# Patient Record
Sex: Male | Born: 1990 | Race: White | Hispanic: No | Marital: Single | Smoking: Current every day smoker
Health system: Southern US, Community
[De-identification: ages and names within clinical notes are randomized; demographics above are authoritative.]

---

## 2015-07-12 ENCOUNTER — Ambulatory Visit (HOSPITAL_COMMUNITY)
Admission: EM | Admit: 2015-07-12 | Discharge: 2015-07-12 | Disposition: A | Discharge status: Home / Self Care | Payer: PRIVATE HEALTH INSURANCE | Attending: Emergency Medicine | Admitting: Emergency Medicine

## 2015-07-12 ENCOUNTER — Emergency Department (HOSPITAL_COMMUNITY): Payer: PRIVATE HEALTH INSURANCE

## 2015-07-12 ENCOUNTER — Emergency Department (HOSPITAL_COMMUNITY): Payer: PRIVATE HEALTH INSURANCE | Admitting: Certified Registered Nurse Anesthetist

## 2015-07-12 ENCOUNTER — Encounter (HOSPITAL_COMMUNITY)
Admission: EM | Disposition: A | Discharge status: Home / Self Care | Payer: Self-pay | Source: Home / Self Care | Attending: Emergency Medicine

## 2015-07-12 ENCOUNTER — Encounter (HOSPITAL_COMMUNITY): Payer: Self-pay | Admitting: Emergency Medicine

## 2015-07-12 DIAGNOSIS — S90551A Superficial foreign body, right ankle, initial encounter: Secondary | ICD-10-CM

## 2015-07-12 DIAGNOSIS — Y93H3 Activity, building and construction: Secondary | ICD-10-CM | POA: Insufficient documentation

## 2015-07-12 DIAGNOSIS — W294XXA Contact with nail gun, initial encounter: Secondary | ICD-10-CM | POA: Diagnosis not present

## 2015-07-12 DIAGNOSIS — Y990 Civilian activity done for income or pay: Secondary | ICD-10-CM | POA: Insufficient documentation

## 2015-07-12 DIAGNOSIS — S91041A Puncture wound with foreign body, right ankle, initial encounter: Secondary | ICD-10-CM | POA: Insufficient documentation

## 2015-07-12 DIAGNOSIS — Z23 Encounter for immunization: Secondary | ICD-10-CM | POA: Diagnosis not present

## 2015-07-12 DIAGNOSIS — F17200 Nicotine dependence, unspecified, uncomplicated: Secondary | ICD-10-CM | POA: Diagnosis not present

## 2015-07-12 HISTORY — PX: INCISION AND DRAINAGE WOUND WITH FOREIGN BODY REMOVAL: SHX5635

## 2015-07-12 SURGERY — INCISION AND DRAINAGE WOUND WITH FOREIGN BODY REMOVAL
Anesthesia: General | Site: Leg Lower | Laterality: Right

## 2015-07-12 MED ORDER — CEFAZOLIN SODIUM-DEXTROSE 2-3 GM-% IV SOLR
INTRAVENOUS | Status: AC
  Administered 2015-07-12: 2 g via INTRAVENOUS
  Filled 2015-07-12: qty 50

## 2015-07-12 MED ORDER — OXYCODONE HCL 5 MG PO TABS: 5.0000 mg | ORAL_TABLET | Freq: Once | ORAL | Status: DC | PRN

## 2015-07-12 MED ORDER — LACTATED RINGERS IV SOLN
INTRAVENOUS | Status: DC
  Administered 2015-07-12: 12:00:00 via INTRAVENOUS

## 2015-07-12 MED ORDER — OXYCODONE-ACETAMINOPHEN 5-325 MG PO TABS: 2.0000 | ORAL_TABLET | ORAL | Status: AC | PRN

## 2015-07-12 MED ORDER — LIDOCAINE HCL (CARDIAC) 20 MG/ML IV SOLN
INTRAVENOUS | Status: AC
  Filled 2015-07-12: qty 5

## 2015-07-12 MED ORDER — PROPOFOL 10 MG/ML IV BOLUS
INTRAVENOUS | Status: DC | PRN
  Administered 2015-07-12: 200 mg via INTRAVENOUS
  Administered 2015-07-12: 50 mg via INTRAVENOUS

## 2015-07-12 MED ORDER — KETOROLAC TROMETHAMINE 30 MG/ML IJ SOLN: 30.0000 mg | Freq: Once | INTRAMUSCULAR | Status: DC | PRN

## 2015-07-12 MED ORDER — MIDAZOLAM HCL 2 MG/2ML IJ SOLN
INTRAMUSCULAR | Status: AC
  Filled 2015-07-12: qty 2

## 2015-07-12 MED ORDER — ARTIFICIAL TEARS OP OINT
TOPICAL_OINTMENT | OPHTHALMIC | Status: AC
  Filled 2015-07-12: qty 3.5

## 2015-07-12 MED ORDER — MEPERIDINE HCL 25 MG/ML IJ SOLN: 6.2500 mg | INTRAMUSCULAR | Status: DC | PRN

## 2015-07-12 MED ORDER — LACTATED RINGERS IV SOLN
INTRAVENOUS | Status: DC | PRN
  Administered 2015-07-12: 13:00:00 via INTRAVENOUS

## 2015-07-12 MED ORDER — SODIUM CHLORIDE 0.9 % IR SOLN
Status: DC | PRN
  Administered 2015-07-12: 6000 mL

## 2015-07-12 MED ORDER — ONDANSETRON HCL 4 MG/2ML IJ SOLN
INTRAMUSCULAR | Status: DC | PRN
  Administered 2015-07-12: 4 mg via INTRAVENOUS

## 2015-07-12 MED ORDER — OXYCODONE HCL 5 MG/5ML PO SOLN: 5.0000 mg | Freq: Once | ORAL | Status: DC | PRN

## 2015-07-12 MED ORDER — CEFAZOLIN SODIUM 1-5 GM-% IV SOLN
1.0000 g | Freq: Once | INTRAVENOUS | Status: AC
  Administered 2015-07-12: 1 g via INTRAVENOUS
  Filled 2015-07-12: qty 50

## 2015-07-12 MED ORDER — FENTANYL CITRATE (PF) 100 MCG/2ML IJ SOLN
100.0000 ug | Freq: Once | INTRAMUSCULAR | Status: AC
  Administered 2015-07-12 (×3): 25 ug via INTRAVENOUS
  Administered 2015-07-12: 50 ug via INTRAVENOUS
  Administered 2015-07-12: 25 ug via INTRAVENOUS
  Filled 2015-07-12: qty 2

## 2015-07-12 MED ORDER — 0.9 % SODIUM CHLORIDE (POUR BTL) OPTIME
TOPICAL | Status: DC | PRN
  Administered 2015-07-12: 1000 mL

## 2015-07-12 MED ORDER — CEPHALEXIN 500 MG PO CAPS: 500.0000 mg | ORAL_CAPSULE | Freq: Three times a day (TID) | ORAL | Status: AC

## 2015-07-12 MED ORDER — FENTANYL CITRATE (PF) 250 MCG/5ML IJ SOLN
INTRAMUSCULAR | Status: AC
  Filled 2015-07-12: qty 5

## 2015-07-12 MED ORDER — HYDROMORPHONE HCL 1 MG/ML IJ SOLN: 0.2500 mg | INTRAMUSCULAR | Status: DC | PRN

## 2015-07-12 MED ORDER — PROMETHAZINE HCL 12.5 MG PO TABS: 12.5000 mg | ORAL_TABLET | Freq: Four times a day (QID) | ORAL | Status: AC | PRN

## 2015-07-12 MED ORDER — MIDAZOLAM HCL 2 MG/2ML IJ SOLN
INTRAMUSCULAR | Status: DC | PRN
  Administered 2015-07-12: 2 mg via INTRAVENOUS

## 2015-07-12 MED ORDER — LIDOCAINE HCL (CARDIAC) 20 MG/ML IV SOLN
INTRAVENOUS | Status: DC | PRN
  Administered 2015-07-12: 100 mg via INTRAVENOUS

## 2015-07-12 MED ORDER — TETANUS-DIPHTH-ACELL PERTUSSIS 5-2.5-18.5 LF-MCG/0.5 IM SUSP
0.5000 mL | Freq: Once | INTRAMUSCULAR | Status: AC
Start: 2015-07-12 — End: 2015-07-12
  Administered 2015-07-12: 0.5 mL via INTRAMUSCULAR
  Filled 2015-07-12: qty 0.5

## 2015-07-12 MED ORDER — PROMETHAZINE HCL 25 MG/ML IJ SOLN: 6.2500 mg | INTRAMUSCULAR | Status: DC | PRN

## 2015-07-12 SURGICAL SUPPLY — 40 items
BANDAGE ELASTIC 6 VELCRO ST LF (GAUZE/BANDAGES/DRESSINGS) ×3 IMPLANT
BLADE SURG 10 STRL SS (BLADE) IMPLANT
BLADE SURG 15 STRL LF DISP TIS (BLADE) IMPLANT
BLADE SURG 15 STRL SS (BLADE)
BNDG GAUZE ELAST 4 BULKY (GAUZE/BANDAGES/DRESSINGS) ×3 IMPLANT
CONT SPEC 4OZ CLIKSEAL STRL BL (MISCELLANEOUS) ×3 IMPLANT
COVER SURGICAL LIGHT HANDLE (MISCELLANEOUS) ×3 IMPLANT
DRAPE OEC MINIVIEW 54X84 (DRAPES) ×3 IMPLANT
DRAPE SURG 17X23 STRL (DRAPES) ×3 IMPLANT
DRSG EMULSION OIL 3X3 NADH (GAUZE/BANDAGES/DRESSINGS) ×3 IMPLANT
DURAPREP 26ML APPLICATOR (WOUND CARE) ×3 IMPLANT
ELECT CAUTERY BLADE 6.4 (BLADE) ×3 IMPLANT
ELECT REM PT RETURN 9FT ADLT (ELECTROSURGICAL) ×3
ELECTRODE REM PT RTRN 9FT ADLT (ELECTROSURGICAL) ×1 IMPLANT
GAUZE SPONGE 4X4 12PLY STRL (GAUZE/BANDAGES/DRESSINGS) ×3 IMPLANT
GLOVE BIOGEL M 6.5 STRL (GLOVE) ×3 IMPLANT
GLOVE BIOGEL PI IND STRL 6.5 (GLOVE) ×1 IMPLANT
GLOVE BIOGEL PI IND STRL 8 (GLOVE) ×1 IMPLANT
GLOVE BIOGEL PI INDICATOR 6.5 (GLOVE) ×2
GLOVE BIOGEL PI INDICATOR 8 (GLOVE) ×2
GLOVE BIOGEL PI ORTHO PRO 7.5 (GLOVE) ×2
GLOVE BIOGEL PI ORTHO PRO SZ8 (GLOVE) ×2
GLOVE PI ORTHO PRO STRL 7.5 (GLOVE) ×1 IMPLANT
GLOVE PI ORTHO PRO STRL SZ8 (GLOVE) ×1 IMPLANT
GLOVE SURG ORTHO 8.0 STRL STRW (GLOVE) ×3 IMPLANT
GOWN STRL REUS W/ TWL LRG LVL3 (GOWN DISPOSABLE) ×1 IMPLANT
GOWN STRL REUS W/ TWL XL LVL3 (GOWN DISPOSABLE) ×1 IMPLANT
GOWN STRL REUS W/TWL 2XL LVL3 (GOWN DISPOSABLE) ×3 IMPLANT
GOWN STRL REUS W/TWL LRG LVL3 (GOWN DISPOSABLE) ×2
GOWN STRL REUS W/TWL XL LVL3 (GOWN DISPOSABLE) ×2
KIT BASIN OR (CUSTOM PROCEDURE TRAY) ×3 IMPLANT
MANIFOLD NEPTUNE II (INSTRUMENTS) ×3 IMPLANT
PACK ORTHO EXTREMITY (CUSTOM PROCEDURE TRAY) ×3 IMPLANT
SET IRRIG Y TYPE TUR BLADDER L (SET/KITS/TRAYS/PACK) ×3 IMPLANT
SUCTION FRAZIER TIP 10 FR DISP (SUCTIONS) ×3 IMPLANT
SUT ETHILON 3 0 PS 1 (SUTURE) ×3 IMPLANT
SUT VIC AB 0 CT1 27 (SUTURE) ×2
SUT VIC AB 0 CT1 27XBRD ANBCTR (SUTURE) ×1 IMPLANT
TOWEL OR 17X26 10 PK STRL BLUE (TOWEL DISPOSABLE) ×3 IMPLANT
TUBING TUR DISP (UROLOGICAL SUPPLIES) IMPLANT

## 2015-07-12 NOTE — Discharge Instructions (Signed)
Bear weight as tolerated in your boot  OK to take off boot for sleeping  Remove dressings in 5 days and ok to shower at that point, keep incision covered

## 2015-07-12 NOTE — ED Notes (Signed)
Patients friend handed front desk pateints car key and blue paper. Paper and Car key given to patients girlfriend.

## 2015-07-12 NOTE — Op Note (Signed)
07/12/2015  2:01 PM  PATIENT:  Eddie Sanchez    PRE-OPERATIVE DIAGNOSIS:  Foreign Body Right Ankle  POST-OPERATIVE DIAGNOSIS:  Same  PROCEDURE:  INCISION AND DRAINAGE WOUND WITH FOREIGN BODY REMOVAL  SURGEON:  Maeola Mchaney, Jewel Baize, MD  ASSISTANT: Janace Litten, OPA-C, present and scrubbed throughout the case, critical for completion in a timely fashion, and for retraction, instrumentation, and closure.   ANESTHESIA:   gen  PREOPERATIVE INDICATIONS:  [name] is a  24 y.o. male with a diagnosis of Foreign Body Right Ankle who failed conservative measures and elected for surgical management.    The risks benefits and alternatives were discussed with the patient preoperatively including but not limited to the risks of infection, bleeding, nerve injury, cardiopulmonary complications, the need for revision surgery, among others, and the patient was willing to proceed.  OPERATIVE IMPLANTS: none  OPERATIVE FINDINGS: no retained metalic FB  BLOOD LOSS: min  COMPLICATIONS: none  TOURNIQUET TIME: none  OPERATIVE PROCEDURE:  Patient was identified in the preoperative holding area and site was marked by me He was transported to the operating theater and placed on the table in supine position taking care to pad all bony prominences. After a preincinduction time out anesthesia was induced. The right lower extremity was prepped and draped in normal sterile fashion and a pre-incision timeout was performed. He received ancef for preoperative antibiotics.   After prepping and draping I removed the nail from his medial Mal. It came out intact the wiring that holds the nails together came out with it I then took multiple fluoroscopic x-rays to confirm that there was no retained metal wiring.  I then made a 3 cm incision at the site of a medial arthroscopic portal and dissected down bluntly protecting his neurovascular strand structures and his tibialis anterior tendon. Next a made a capsulotomy and  exposed the joint. I visualized the joint I was able to see the penetration and the talus of the nail. It penetrated the nonweightbearing dome.  I then examined the gutters of the ankle and confirm no retained foreign body. I then irrigated the ankle with 6 L of saline. With thorough agitation.  After this I closed the capsule with a Vicryls stitch followed by a nylon stitch and the skin.  Sterile dressing was applied he was placed in a boot and taken the PACU in stable condition after being awoken.  POST OPERATIVE PLAN: WBAT, Keflex. Ambulate for dvt px.     This note was generated using a template and dragon dictation system. In light of that, I have reviewed the note and all aspects of it are applicable to this case. Any dictation errors are due to the computerized dictation system.

## 2015-07-12 NOTE — H&P (Signed)
ORTHOPAEDIC CONSULTATION  REQUESTING PHYSICIAN: Sherwood Gambler, MD  Chief Complaint: nail injury to right ankle   HPI: Eddie Sanchez is a 23 y.o. 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 He is not sure of his tetanus status. He denies any other injuries.  History reviewed. No pertinent past medical history. History reviewed. No pertinent past surgical history. History   Social History  . Marital Status: Single    Spouse Name: N/A  . Number of Children: N/A  . Years of Education: N/A   Social History Main Topics  . Smoking status: Current Every Day Smoker  . Smokeless tobacco: Not on file  . Alcohol Use: Yes  . Drug Use: No  . Sexual Activity: Not on file   Other Topics Concern  . None   Social History Narrative  . None   No family history on file. No Known Allergies Prior to Admission medications   Medication Sig Start Date End Date Taking? Authorizing Provider  ibuprofen (ADVIL,MOTRIN) 200 MG tablet Take 400 mg by mouth daily as needed for mild pain.   Yes Historical Provider, MD   Dg Ankle Complete Right  07/12/2015   CLINICAL DATA:  Nail gun injury to ankle  EXAM: RIGHT ANKLE - COMPLETE 3+ VIEW  COMPARISON:  None.  FINDINGS: Metallic nail through the medial malleolus and likely into the medial aspect of the talus.  Ankle mortise is otherwise intact.  Visualized soft tissues are otherwise unremarkable.  IMPRESSION: Metallic nail through the medial malleolus and likely into the medial aspect of the talus.   Electronically Signed   By: Julian Hy M.D.   On: 07/12/2015 09:36    Positive ROS: All other systems have been reviewed and were otherwise negative with the exception of those mentioned in the HPI and as above.  Labs cbc No results for input(s): WBC, HGB, HCT, PLT in the last 72 hours.  Labs inflam No results for input(s): CRP in the last 72 hours.  Invalid input(s):  ESR  Labs coag No results for input(s): INR, PTT in the last 72 hours.  Invalid input(s): PT  No results for input(s): NA, K, CL, CO2, GLUCOSE, BUN, CREATININE, CALCIUM in the last 72 hours.  Physical Exam: Filed Vitals:   07/12/15 0900  BP: 116/75  Pulse: 89  Temp:   Resp:    General: Alert, no acute distress Cardiovascular: No pedal edema Respiratory: No cyanosis, no use of accessory musculature GI: No organomegaly, abdomen is soft and non-tender Skin: No lesions in the area of chief complaint other than those listed below in MSK exam.  Neurologic: Sensation intact distally Psychiatric: Patient is competent for consent with normal mood and affect Lymphatic: No axillary or cervical lymphadenopathy  MUSCULOSKELETAL:  On the right lower extremity he has a nail head visible with penetration just above the medial Mal. Distally he has sensation intact to all nerve roots good pulses and is able wiggle his toes. Other extremities are atraumatic with painless ROM and NVI.  Assessment: Nail penetration of ankle joint  Plan: Updated tetanus.  I will take him urgently to the operating room for removal of nail followed by surgical arthrotomy irrigation and debridement of his ankle joint given the nail penetration of the ankle joint. Per his report the nails are connected by 2 wires.    Renette Butters, MD Cell [phone]  07/12/2015 9:46 AM

## 2015-07-12 NOTE — ED Notes (Signed)
Pt belongings placed in bags, including shirt pants socks belt wallet cell phone and shoes. Given to patient's girlfriend.

## 2015-07-12 NOTE — Progress Notes (Signed)
Orthopedic Tech Progress Note Patient Details:  Eddie Sanchez 26-Mar-1991 [id number] Delivered CAM walker to OR. Ortho Devices Type of Ortho Device: CAM walker Ortho Device/Splint Interventions: Other (comment)   Lesle Chris 07/12/2015, 1:40 PM

## 2015-07-12 NOTE — Anesthesia Preprocedure Evaluation (Addendum)
Anesthesia Evaluation  Patient identified by MRN, date of birth, ID band Patient awake    Reviewed: Allergy & Precautions, NPO status , Patient's Chart, lab work & pertinent test results  Airway Mallampati: I  TM Distance: >3 FB Neck ROM: Full    Dental no notable dental hx. (+) Dental Advisory Given, Teeth Intact   Pulmonary neg pulmonary ROS, Current Smoker,  breath sounds clear to auscultation  Pulmonary exam normal       Cardiovascular negative cardio ROS Normal cardiovascular examRhythm:Regular Rate:Normal     Neuro/Psych negative neurological ROS  negative psych ROS   GI/Hepatic negative GI ROS, Neg liver ROS,   Endo/Other  negative endocrine ROS  Renal/GU negative Renal ROS     Musculoskeletal negative musculoskeletal ROS (+)   Abdominal   Peds  Hematology negative hematology ROS (+)   Anesthesia Other Findings   Reproductive/Obstetrics                           Anesthesia Physical Anesthesia Plan  ASA: II  Anesthesia Plan: General   Post-op Pain Management:    Induction: Intravenous  Airway Management Planned: LMA  Additional Equipment:   Intra-op Plan:   Post-operative Plan: Extubation in OR  Informed Consent: I have reviewed the patients History and Physical, chart, labs and discussed the procedure including the risks, benefits and alternatives for the proposed anesthesia with the patient or authorized representative who has indicated his/her understanding and acceptance.   Dental advisory given  Plan Discussed with: CRNA, Anesthesiologist and Surgeon  Anesthesia Plan Comments:        Anesthesia Quick Evaluation

## 2015-07-12 NOTE — ED Notes (Signed)
Pt. 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

## 2015-07-12 NOTE — ED Notes (Signed)
Pt states he does not think he needs an IV or IV pain medications. States it does not hurt that bad.

## 2015-07-12 NOTE — Anesthesia Procedure Notes (Signed)
Procedure Name: LMA Insertion Date/Time: 07/12/2015 1:15 PM Performed by: Rise Patience T Pre-anesthesia Checklist: Patient identified, Emergency Drugs available, Suction available and Patient being monitored Patient Re-evaluated:Patient Re-evaluated prior to inductionOxygen Delivery Method: Circle system utilized Preoxygenation: Pre-oxygenation with 100% oxygen Intubation Type: IV induction Ventilation: Mask ventilation without difficulty LMA: LMA inserted LMA Size: 4.0 Number of attempts: 1 Placement Confirmation: positive ETCO2 and breath sounds checked- equal and bilateral Tube secured with: Tape Dental Injury: Teeth and Oropharynx as per pre-operative assessment

## 2015-07-12 NOTE — Transfer of Care (Signed)
Immediate Anesthesia Transfer of Care Note  Patient: Eddie Sanchez  Procedure(s) Performed: Procedure(s): INCISION AND DRAINAGE WOUND WITH FOREIGN BODY REMOVAL (Right)  Patient Location: PACU  Anesthesia Type:General  Level of Consciousness: awake, alert  and oriented  Airway & Oxygen Therapy: Patient Spontanous Breathing and Patient connected to nasal cannula oxygen  Post-op Assessment: Report given to RN, Post -op Vital signs reviewed and stable and Patient moving all extremities X 4  Post vital signs: Reviewed and stable  Last Vitals:  Filed Vitals:   07/12/15 1121  BP: 114/55  Pulse: 83  Temp:   Resp: 16    Complications: No apparent anesthesia complications

## 2015-07-12 NOTE — Anesthesia Postprocedure Evaluation (Signed)
Anesthesia Post Note  Patient: Eddie Sanchez  Procedure(s) Performed: Procedure(s) (LRB): INCISION AND DRAINAGE WOUND WITH FOREIGN BODY REMOVAL (Right)  Anesthesia type: General  Patient location: PACU  Post pain: Pain level controlled  Post assessment: Post-op Vital signs reviewed  Last Vitals: BP 118/68 mmHg  Pulse 62  Temp(Src) 36.4 C (Oral)  Resp 18  Ht 6' (1.829 m)  Wt 170 lb (77.111 kg)  BMI 23.05 kg/m2  SpO2 98%  Post vital signs: Reviewed  Level of consciousness: sedated  Complications: No apparent anesthesia complications

## 2015-07-12 NOTE — ED Provider Notes (Signed)
CSN: [id number]     Arrival date & time 07/12/15  0825 History   First MD Initiated Contact with Patient 07/12/15 [phone]     Chief Complaint  Patient presents with  . Ankle Pain    nail     (Consider location/radiation/quality/duration/timing/severity/associated sxs/prior Treatment) HPI  24 year old male presents after accidentally shooting a nail into his right medial ankle. The patient was receiving this morning and states that the trigger on his nail gun was acting up all morning. He was getting another single and put the nail gun down on his foot and that seemed to set off the trigger and his medial ankle was punctured with the nail. The nail is hubbed and is approximately 1-1/4 inch in length. Rates his pain as a 5/10. When he moves his toes he feels a little tingling in them but otherwise denies numbness or weakness. He did not try and walk on his leg. Did not eat breakfast this morning, last ate last night around 6 PM.  History reviewed. No pertinent past medical history. History reviewed. No pertinent past surgical history. No family history on file. History  Substance Use Topics  . Smoking status: Current Every Day Smoker  . Smokeless tobacco: Not on file  . Alcohol Use: Yes    Review of Systems  Musculoskeletal: Positive for arthralgias.  Skin: Positive for wound. Negative for color change.  Neurological: Negative for weakness and numbness.  All other systems reviewed and are negative.     Allergies  Review of patient's allergies indicates no known allergies.  Home Medications   Prior to Admission medications   Medication Sig Start Date End Date Taking? Authorizing Provider  ibuprofen (ADVIL,MOTRIN) 200 MG tablet Take 400 mg by mouth daily as needed for mild pain.   Yes Historical Provider, MD   BP 123/71 mmHg  Pulse 106  Temp(Src) 98 F (36.7 C) (Oral)  Resp 17  Ht 6' (1.829 m)  Wt 170 lb (77.111 kg)  BMI 23.05 kg/m2  SpO2 98% Physical Exam    Constitutional: He is oriented to person, place, and time. He appears well-developed and well-nourished.  HENT:  Head: Normocephalic and atraumatic.  Right Ear: External ear normal.  Left Ear: External ear normal.  Nose: Nose normal.  Eyes: Right eye exhibits no discharge. Left eye exhibits no discharge.  Neck: Neck supple.  Cardiovascular: Intact distal pulses.   Pulses:      Dorsalis pedis pulses are 2+ on the right side, and 2+ on the left side.  Pulmonary/Chest: Effort normal.  Abdominal: Soft. He exhibits no distension. There is no tenderness.  Musculoskeletal:       Right ankle: He exhibits decreased range of motion. Tenderness.       Feet:  No tenting of the skin  Neurological: He is alert and oriented to person, place, and time.  Skin: Skin is warm and dry.  Nursing note and vitals reviewed.   ED Course  Procedures (including critical care time) Labs Review Labs Reviewed - No data to display  Imaging Review Dg Ankle Complete Right  07/12/2015   CLINICAL DATA:  Nail gun injury to ankle  EXAM: RIGHT ANKLE - COMPLETE 3+ VIEW  COMPARISON:  None.  FINDINGS: Metallic nail through the medial malleolus and likely into the medial aspect of the talus.  Ankle mortise is otherwise intact.  Visualized soft tissues are otherwise unremarkable.  IMPRESSION: Metallic nail through the medial malleolus and likely into the medial aspect of the talus.  Electronically Signed   By: Charline Bills M.D.   On: 07/12/2015 09:36     EKG Interpretation None      MDM   Final diagnoses:  Foreign body of ankle, right, initial encounter    Patient is NV intact, declines pain meds and appears well. Last ate last night. Xray shows nail has gone through joint. D/w ortho, Dr. Eulah Pont, who plans to take to OR for removal and good washout. Last tetanus 4 years ago. Will give ancef per ortho.    Pricilla Loveless, MD 07/12/15 [phone]

## 2015-07-14 ENCOUNTER — Encounter (HOSPITAL_COMMUNITY): Payer: Self-pay | Admitting: Orthopedic Surgery

## 2017-02-25 IMAGING — DX DG ANKLE COMPLETE 3+V*R*
3 series · 3 of 3 positions shown · non-contrast
Comparison: None.

CLINICAL DATA: Nail gun injury to ankle

EXAM:
RIGHT ANKLE - COMPLETE 3+ VIEW

[ankle ap]
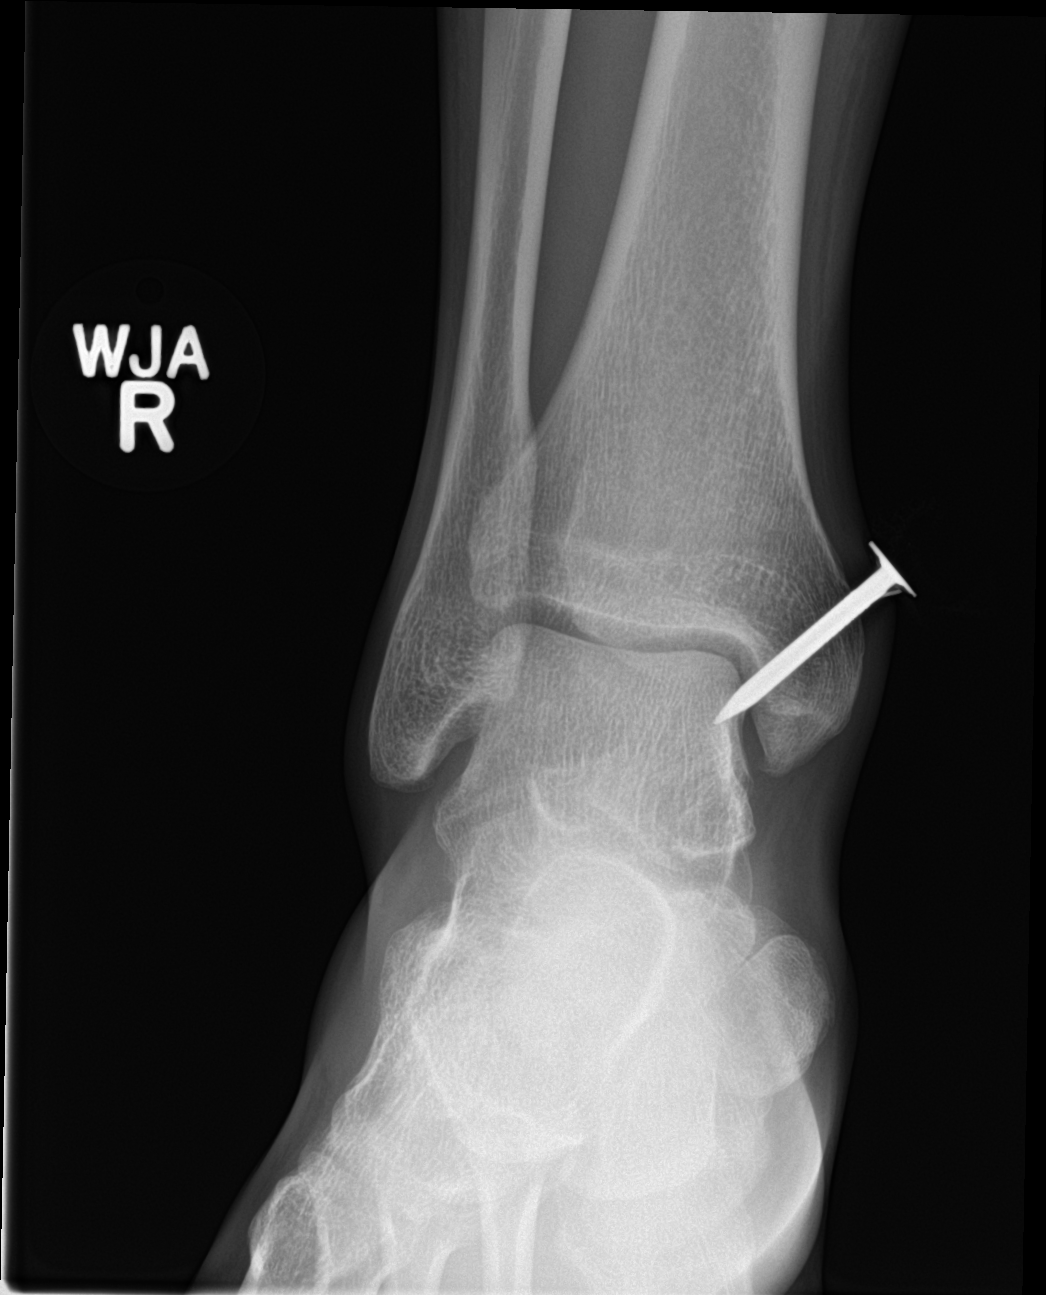

[ankle obl]
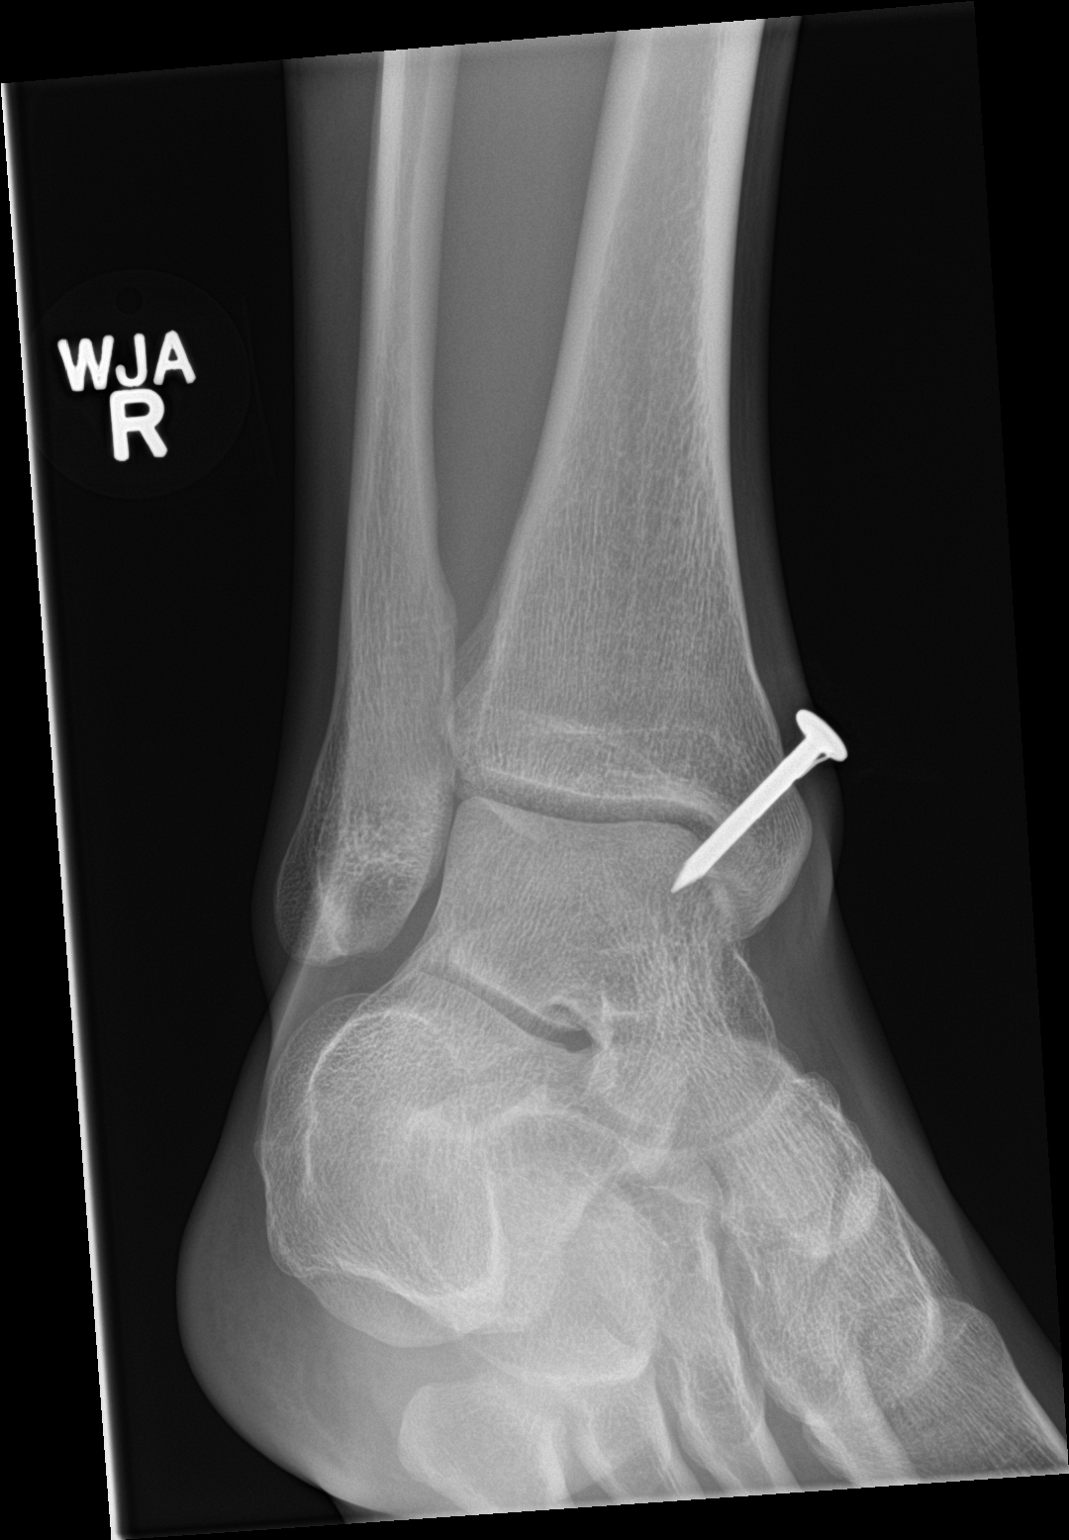

[ankle lat]
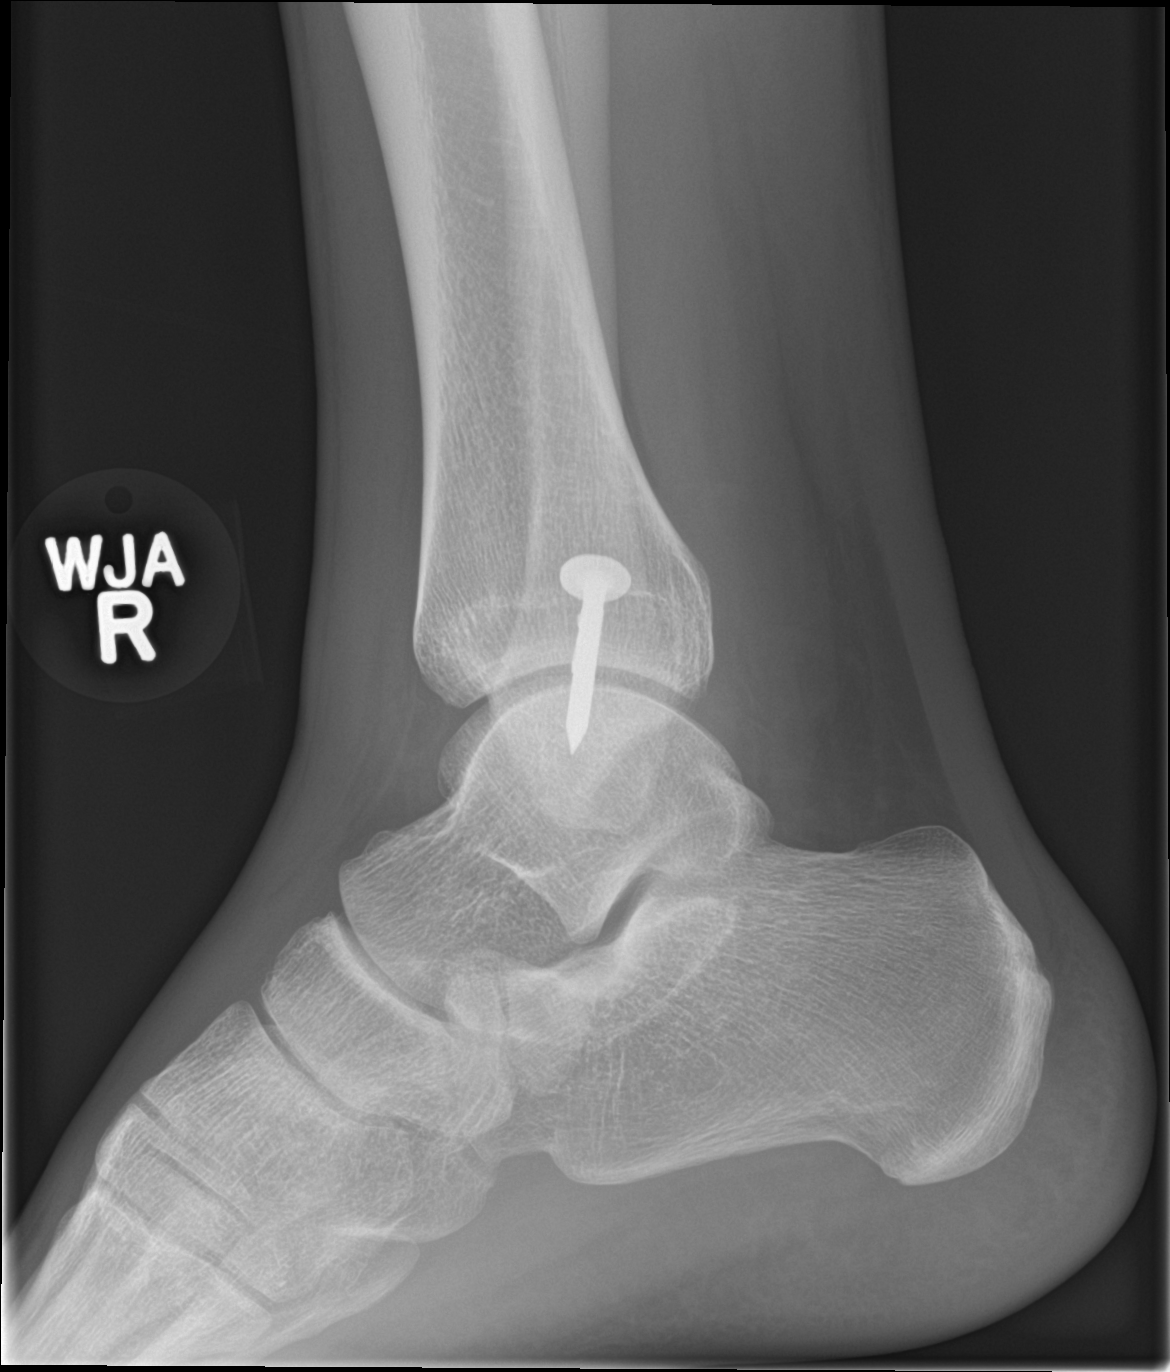

[3 of 3 positions shown; findings below may reference images not displayed]

FINDINGS: Metallic nail through the medial malleolus and likely into the
medial aspect of the talus.

Ankle mortise is otherwise intact.

Visualized soft tissues are otherwise unremarkable.
IMPRESSION: Metallic nail through the medial malleolus and likely into the
medial aspect of the talus.
